# Patient Record
Sex: Male | Born: 1985 | Race: Black or African American | Hispanic: No | Marital: Single | Smoking: Never smoker
Health system: Southern US, Community
[De-identification: ages and names within clinical notes are randomized; demographics above are authoritative.]

---
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
## 2006-04-25 ENCOUNTER — Encounter (INDEPENDENT_AMBULATORY_CARE_PROVIDER_SITE_OTHER): Payer: Self-pay | Admitting: Family Medicine

## 2006-04-25 ENCOUNTER — Ambulatory Visit: Payer: Self-pay | Admitting: Family Medicine

## 2006-04-25 LAB — CONVERTED CEMR LAB: GC Probe Amp, Genital: POSITIVE — AB

## 2006-06-02 DIAGNOSIS — F909 Attention-deficit hyperactivity disorder, unspecified type: Secondary | ICD-10-CM | POA: Insufficient documentation

## 2006-07-05 ENCOUNTER — Encounter (INDEPENDENT_AMBULATORY_CARE_PROVIDER_SITE_OTHER): Payer: Self-pay | Admitting: Family Medicine

## 2006-07-21 ENCOUNTER — Ambulatory Visit: Payer: Self-pay | Admitting: Family Medicine

## 2006-07-21 ENCOUNTER — Encounter: Payer: Self-pay | Admitting: Family Medicine

## 2006-07-21 DIAGNOSIS — B360 Pityriasis versicolor: Secondary | ICD-10-CM

## 2006-07-22 LAB — CONVERTED CEMR LAB: Chlamydia, Swab/Urine, PCR: NEGATIVE

## 2006-10-26 ENCOUNTER — Encounter: Payer: Self-pay | Admitting: *Deleted

## 2008-04-11 ENCOUNTER — Encounter (INDEPENDENT_AMBULATORY_CARE_PROVIDER_SITE_OTHER): Payer: Self-pay | Admitting: *Deleted

## 2008-04-11 ENCOUNTER — Ambulatory Visit: Payer: Self-pay | Admitting: Family Medicine

## 2008-04-11 ENCOUNTER — Telehealth: Payer: Self-pay | Admitting: *Deleted

## 2008-04-11 ENCOUNTER — Encounter: Payer: Self-pay | Admitting: Family Medicine

## 2008-04-11 LAB — CONVERTED CEMR LAB
Chlamydia, DNA Probe: NEGATIVE
GC Probe Amp, Genital: NEGATIVE

## 2008-04-15 ENCOUNTER — Encounter: Payer: Self-pay | Admitting: *Deleted

## 2010-08-21 NOTE — Assessment & Plan Note (Signed)
[facility]                             [facility] OFFICE NOTE   Jonathan Marquez, Jonathan Marquez                     MRN:          [id number]  DATE:12/24/2005                            DOB:          1985/05/18    HISTORY:  A 25 year old black male new to the practice here to establish.  He has complaints only of eyes being sensitive to light and pain in his left  shoulder and dizzy with rolling over for about two years.  His shoulder  started hurting him when he was at wilderness camp.  A big log was dropped  on his left shoulder.  It hurt the day afterwards and has bothered him since  that time. He works at [employer] now and has hurt him much more since starting  work there.  His last doctor's appointment was two to three months ago.  He  was at [facility], saw a different doctor every time.  He wants to establish with one regular doctor.   PAST MEDICAL HISTORY:  He was seen in the emergency room for his left knee  pain.  Had pressure equalization tubes and tonsillectomy in 1995.  He denies  rheumatic fever, scarlet fever, Strep infection, high blood pressure, heart  disease, asthma, pneumonia, diabetes, liver disease or thyroid disease.   ALLERGIES:  He has no known drug allergies.   SOCIAL HABITS:  He has never smoked.  Has had two drinks in his lifetime.  He does not use drugs.   MEDICATIONS:  He is on no medications regularly.   REVIEW OF SYSTEMS:  Significant for dizziness that is often.  Syncope at  work in the heat at [employer] twice, no sequelae since.  Has ringing of his ears.  His last dental examination was 2004.  He has occasional difficulty  breathing due to being uncomfortable in bed with occasional shortness of  breath and coughing causing hemoptysis once.  He has had increased appetite  since working at [employer]. He has occasional nocturia with mild decreased stream,  otherwise head, ears, eyes, nose and throat, teeth and  gums,  cardiorespiratory, gastrointestinal, genitourinary systems are  noncontributory.   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  He is single. He does  have a girlfriend.  Sometimes uses protection, sometimes doesn't.   FAMILY HISTORY:  He is adopted and does not know his medical history at all  from his family other than his mother was an alcoholic and drug addict.   IMMUNIZATIONS:  His last tetanus shot was this year.   PHYSICAL EXAMINATION:  GENERAL APPEARANCE:  This is a well-developed, well-  nourished 25 year old black male in no acute distress.  VITAL SIGNS:  Temperature 98.3.  Pulse 72.  Blood pressure 110/60. Weight  161, 65-1/2 inches. NO KNOWN DRUG ALLERGIES.  HEENT:  Within normal limits.  NECK:  Without adenopathy.  Thyroid is without nodularity.  LUNGS:  Clear to auscultation.  BACK:  Straight, nontender with no costovertebral angle tenderness.  HEART:  Regular rate and rhythm without murmurs.  Pulses are 2+ throughout.  Carotids without bruits.  CHEST:  Symmetrical to excursion.  ABDOMEN:  Soft, nontender.  Good bowel sounds.  No masses.  EXTREMITIES:  Without cyanosis, clubbing or edema.  NEUROLOGICAL:  Examination is significant for cranial nerves II-XII within  normal limits.  Motor and sensory are normal.  Deep tendon reflexes are 2+  bilaterally.  Rapid alternating movements heel to shin, finger to nose,  tandem gait, heel walk, toe walk and Romberg are all normal.  SKIN:  Without lesions.   ASSESSMENT:  1. Vertigo, assume benign positional.  2. Left shoulder pain.   PLAN:  Trial of Antivert 25 mg q.6h. p.r.n. #30 with two refills.  Left  shoulder trauma and pain, will refer to an orthopedist.  Have him return  otherwise as needed.                                   Arta Silence, MD   RNS/MedQ  DD:  12/24/2005  DT:  12/27/2005  Job #:  [id number]

## 2017-05-16 ENCOUNTER — Encounter (HOSPITAL_COMMUNITY): Payer: Self-pay | Admitting: *Deleted

## 2017-05-16 ENCOUNTER — Other Ambulatory Visit: Payer: Self-pay

## 2017-05-16 DIAGNOSIS — N451 Epididymitis: Secondary | ICD-10-CM | POA: Insufficient documentation

## 2017-05-16 DIAGNOSIS — F909 Attention-deficit hyperactivity disorder, unspecified type: Secondary | ICD-10-CM | POA: Insufficient documentation

## 2017-05-16 LAB — URINALYSIS, ROUTINE W REFLEX MICROSCOPIC
Bilirubin Urine: NEGATIVE
Glucose, UA: NEGATIVE mg/dL
Hgb urine dipstick: NEGATIVE
KETONES UR: NEGATIVE mg/dL
LEUKOCYTES UA: NEGATIVE
NITRITE: NEGATIVE
Protein, ur: NEGATIVE mg/dL
Specific Gravity, Urine: 1.015 (ref 1.005–1.030)
pH: 5 (ref 5.0–8.0)

## 2017-05-16 NOTE — ED Triage Notes (Signed)
Pt was seen and treated for STD at the health dept and was diagnosed with nongonococcal urethritis. Reports L testicular pain for the past few days. Pain started after lifting tree branches. Also reports ejaculating a yellow discharge

## 2017-05-17 ENCOUNTER — Emergency Department (HOSPITAL_COMMUNITY)
Admission: EM | Admit: 2017-05-17 | Discharge: 2017-05-17 | Disposition: A | Discharge status: Home / Self Care | Payer: Self-pay | Attending: Emergency Medicine | Admitting: Emergency Medicine

## 2017-05-17 ENCOUNTER — Emergency Department (HOSPITAL_COMMUNITY): Payer: Self-pay

## 2017-05-17 DIAGNOSIS — N451 Epididymitis: Secondary | ICD-10-CM

## 2017-05-17 DIAGNOSIS — N50812 Left testicular pain: Secondary | ICD-10-CM

## 2017-05-17 DIAGNOSIS — N50819 Testicular pain, unspecified: Secondary | ICD-10-CM

## 2017-05-17 MED ORDER — DOXYCYCLINE HYCLATE 100 MG PO CAPS: 100.0000 mg | ORAL_CAPSULE | Freq: Two times a day (BID) | ORAL | 0 refills | Status: AC

## 2017-05-17 MED ORDER — DOXYCYCLINE HYCLATE 100 MG PO TABS
100.0000 mg | ORAL_TABLET | Freq: Once | ORAL | Status: AC
  Administered 2017-05-17: 100 mg via ORAL
  Filled 2017-05-17: qty 1

## 2017-05-17 MED ORDER — CEFTRIAXONE SODIUM 250 MG IJ SOLR
250.0000 mg | Freq: Once | INTRAMUSCULAR | Status: AC
  Administered 2017-05-17: 250 mg via INTRAMUSCULAR
  Filled 2017-05-17: qty 250

## 2017-05-17 NOTE — Discharge Instructions (Signed)
1. Medications: Doxycycline, usual home medications 2. Treatment: rest, drink plenty of fluids, use a condom with every sexual encounter 3. Follow Up: Please followup with your primary doctor in 3 days for discussion of your diagnoses and further evaluation after today's visit; if you do not have a primary care doctor use the resource guide provided to find one; Please return to the ER for worsening symptoms, high fevers or persistent vomiting.   Please inform all sexual partners if you test positive for any of these diseases.

## 2017-05-17 NOTE — ED Provider Notes (Signed)
[facility]   CSN: [id number] Arrival date & time: 05/16/17  2240     History   Chief Complaint Chief Complaint  Patient presents with  . Testicle Pain    HPI Jonathan Marquez is a 32 y.o. male with a hx of ADHD, tinea versicolor presents to the Emergency Department complaining of gradual, persistent, progressively worsening left testicular swelling and pain onset 2 days ago.  Pt reports approx 1 week ago he was evaluated at the health department for penile discharge.  At that time he was tested for STDs and was treated with Azithromycin.  Pt reports he was not given rocephin at that visit.  Pt denies known trauma, fever, nausea, vomiting, painful BMs.  Pt states he is sexually active with 1 male partner at this time, but was recently sexually active with several females.  Associated symptoms include lower abdominal pain and low back pain tonight.    The history is provided by the patient and medical records. No language interpreter was used.    History reviewed. No pertinent past medical history.  Patient Active Problem List   Diagnosis Date Noted  . 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 ATTENTION DEFICIT, W/HYPERACTIVITY 06/02/2006    History reviewed. No pertinent surgical history.     Home Medications    Prior to Admission medications   Medication Sig Start Date End Date Taking? Authorizing Provider  ibuprofen (ADVIL,MOTRIN) 200 MG tablet Take 200 mg by mouth every 6 (six) hours as needed for mild pain.   Yes [provider]  doxycycline (VIBRAMYCIN) 100 MG capsule Take 1 capsule (100 mg total) by mouth 2 (two) times daily. 05/17/17   Laurina Fischl, Dahlia ClientHannah, PA-C    Family History No family history on file.  Social History Social History   Tobacco Use  . Smoking status: Never Smoker  . Smokeless tobacco: Never Used  Substance Use Topics  . Alcohol use: Yes  . Drug use: Yes    Types: Marijuana      Allergies   Patient has no known allergies.   Review of Systems Review of Systems  Constitutional: Negative for appetite change, diaphoresis, fatigue, fever and unexpected weight change.  HENT: Negative for mouth sores.   Eyes: Negative for visual disturbance.  Respiratory: Negative for cough, chest tightness, shortness of breath and wheezing.   Cardiovascular: Negative for chest pain.  Gastrointestinal: Negative for abdominal pain, constipation, diarrhea, nausea and vomiting.  Endocrine: Negative for polydipsia, polyphagia and polyuria.  Genitourinary: Positive for scrotal swelling and testicular pain. Negative for dysuria, frequency, hematuria and urgency.  Musculoskeletal: Negative for back pain and neck stiffness.  Skin: Negative for rash.  Allergic/Immunologic: Negative for immunocompromised state.  Neurological: Negative for syncope, light-headedness and headaches.  Hematological: Does not bruise/bleed easily.  Psychiatric/Behavioral: Negative for sleep disturbance. The patient is not nervous/anxious.      Physical Exam Updated Vital Signs BP (!) 159/103 (BP Location: Right Arm)   Pulse 69   Temp 98.2 F (36.8 C) (Oral)   Resp 18   SpO2 97%   Physical Exam  Constitutional: He appears well-developed and well-nourished. No distress.  Awake, alert, nontoxic appearance  HENT:  Head: Normocephalic and atraumatic.  Eyes: Conjunctivae are normal. No scleral icterus.  Neck: Normal range of motion. Neck supple.  Cardiovascular: Normal rate, regular rhythm and intact distal pulses.  Pulses:      Radial pulses are 2+ on the right side, and 2+ on the left side.  Pulmonary/Chest: Effort normal. No respiratory distress.  Equal chest expansion  Abdominal: Soft. Bowel sounds are normal. He exhibits no mass. There is no tenderness. There is no rebound and no guarding. Hernia confirmed negative in the right inguinal area and confirmed negative in the left inguinal area.   Genitourinary: Penis normal. Right testis shows no mass, no swelling and no tenderness. Right testis is descended. Cremasteric reflex is not absent on the right side. Left testis shows swelling and tenderness. Left testis shows no mass. Left testis is descended. Cremasteric reflex is not absent on the left side. Circumcised. No penile tenderness. No discharge found.  Genitourinary Comments: Chaperone present  Musculoskeletal: Normal range of motion. He exhibits no edema.  Lymphadenopathy: Inguinal adenopathy noted on the left side. No inguinal adenopathy noted on the right side.  Neurological: He is alert.  Speech is clear and goal oriented Moves extremities without ataxia  Skin: Skin is warm and dry. He is not diaphoretic.  Psychiatric: He has a normal mood and affect.  Nursing note and vitals reviewed.    ED Treatments / Results  Labs (all labs ordered are listed, but only abnormal results are displayed) Labs Reviewed  URINALYSIS, ROUTINE W REFLEX MICROSCOPIC     Radiology [place] Scrotom Doppler  Result Date: 05/17/2017 CLINICAL DATA:  32 y/o  M; left testicle pain for 1 week. EXAM: SCROTAL ULTRASOUND DOPPLER ULTRASOUND OF THE TESTICLES TECHNIQUE: Complete ultrasound examination of the testicles, epididymis, and other scrotal structures was performed. Color and spectral Doppler ultrasound were also utilized to evaluate blood flow to the testicles. COMPARISON:  None. FINDINGS: Right testicle Measurements: 3.9 x 2.1 x 2.4 cm. No mass or microlithiasis visualized. Left testicle Measurements: 4.1 x 2.2 x 2.3 cm. No mass or microlithiasis visualized. Right epididymis:  Normal in size and appearance. Left epididymis: Heterogeneous, enlarged, hypervascular, measuring 1.7 x 0.9 x 1.4 cm. Hydrocele:  Left-sided hydrocele. Varicocele:  None visualized. Pulsed Doppler interrogation of both testes demonstrates normal low resistance arterial and venous waveforms bilaterally. Other: Mild left scrotal wall  edema. IMPRESSION: Acute left epididymitis. Electronically Signed   By: Mitzi Hansen M.D.   On: 05/17/2017 01:51    Procedures Procedures (including critical care time)  Medications Ordered in ED Medications  cefTRIAXone (ROCEPHIN) injection 250 mg (250 mg Intramuscular Given 05/17/17 0253)  doxycycline (VIBRA-TABS) tablet 100 mg (100 mg Oral Given 05/17/17 0253)     Initial Impression / Assessment and Plan / ED Course  I have reviewed the triage vital signs and the nursing notes.  Pertinent labs & imaging results that were available during my care of the patient were reviewed by me and considered in my medical decision making (see chart for details).     Patient presents for left testicle and scrotal pain.  He was recently treated for STDs with azithromycin but was not given Rocephin.  Urinalysis is without evidence of urinary tract infection.  Ultrasound shows acute left epididymitis.  Clinical exam correlates with this.  No evidence of testicular torsion.  Concerned that patient was not treated for potential gonorrhea.  Will give Rocephin here in the emergency department.  No hernias on exam.  Patient is afebrile, abdomen is soft and nontender, no painful bowel movements.  Doubt prostatitis.  Patient will be given doxycycline to take at home.  We did not repeat STD testing as this was completed with the health department though patient does not yet have results.  Discussed with patient the importance of completion of his  antibiotics and return to the emergency department for painful bowel movements, fever, worsening abdominal pain, vomiting or other concerns.  The patient states understanding and is in agreement with the plan.  Final Clinical Impressions(s) / ED Diagnoses   Final diagnoses:  Epididymitis  Testicular pain    ED Discharge Orders        Ordered    doxycycline (VIBRAMYCIN) 100 MG capsule  2 times daily     05/17/17 0324       Anneke Cundy, Dahlia Client,  PA-C 05/17/17 0329    Horton, Mayer Masker, MD 05/17/17 [phone]

## 2020-02-03 IMAGING — US US SCROTUM W/ DOPPLER COMPLETE
1 series · 14 of 25 positions shown · non-contrast
Comparison: None.

CLINICAL DATA: 31 y/o  M; left testicle pain for 1 week.

EXAM:
SCROTAL ULTRASOUND
DOPPLER ULTRASOUND OF THE TESTICLES
TECHNIQUE: Complete ultrasound examination of the testicles, epididymis, and
other scrotal structures was performed. Color and spectral Doppler
ultrasound were also utilized to evaluate blood flow to the
testicles.

[Series 1: us scrotum w/ doppler complete · 0.06mm/px · 14 of 52 slices shown]
[im 1/52]
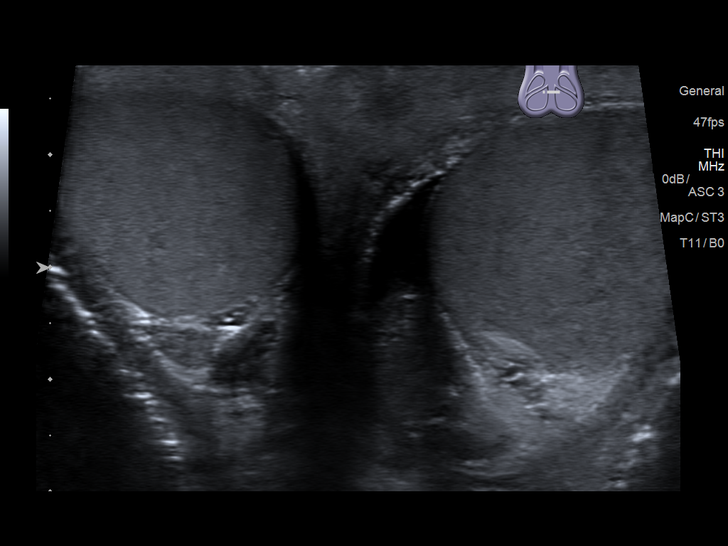
[im 5/52]
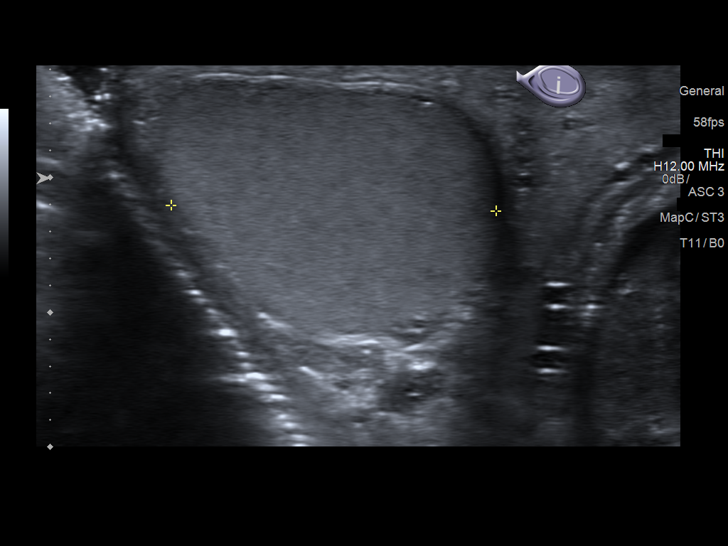
[im 9/52]
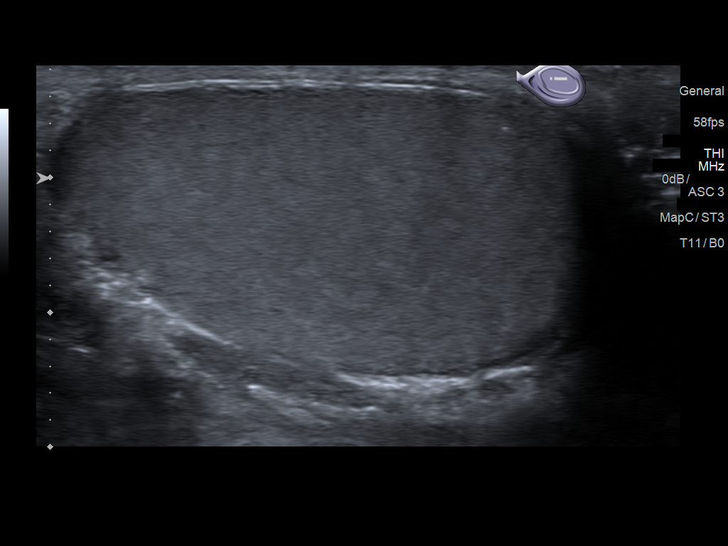
[im 13/52]
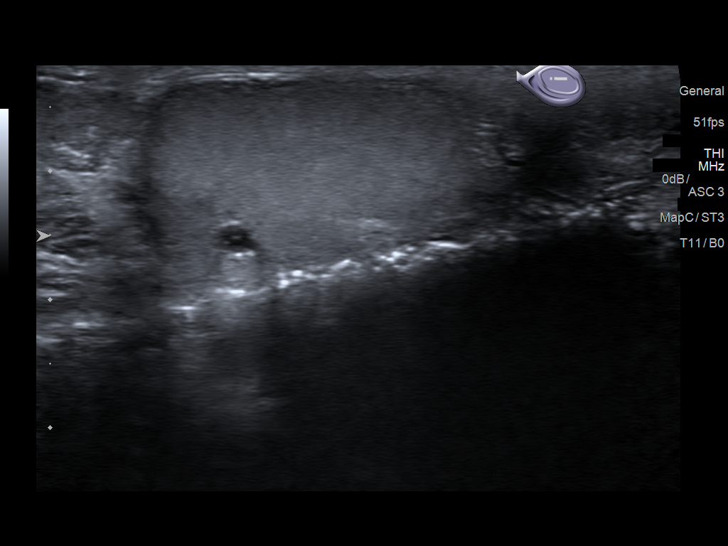
[im 18/52]
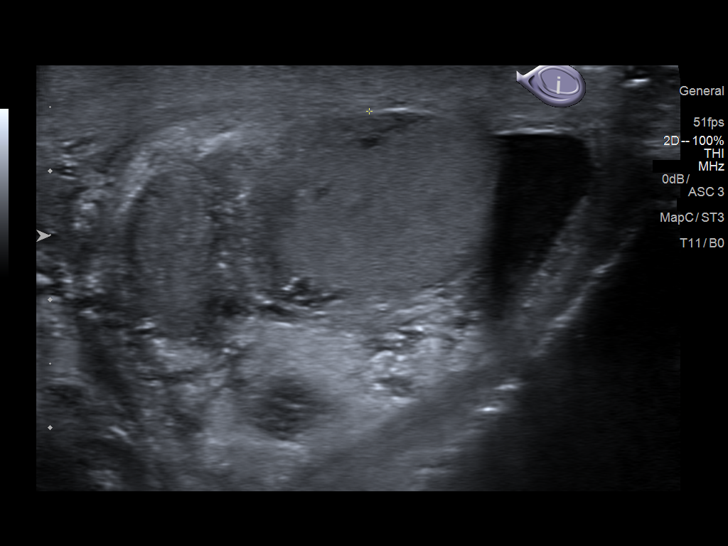
[im 20/52]
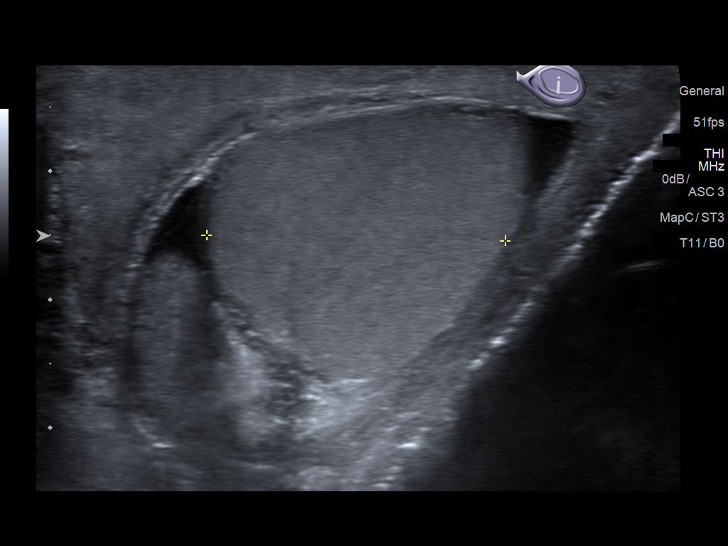
[im 24/52]
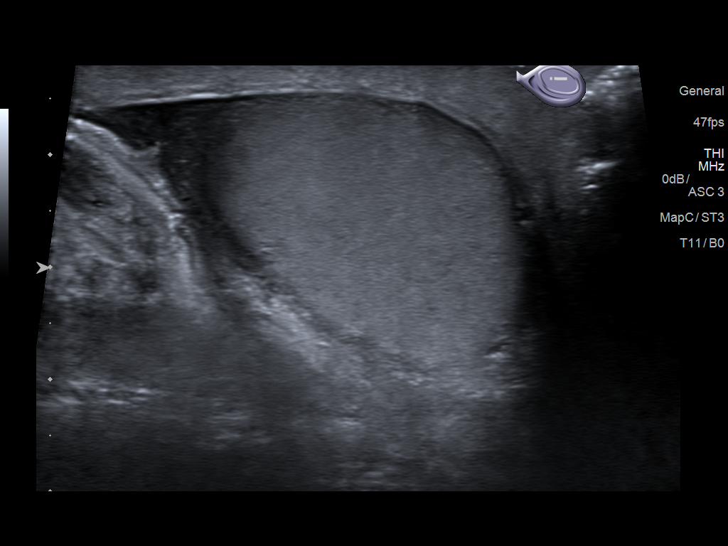
[im 28/52]
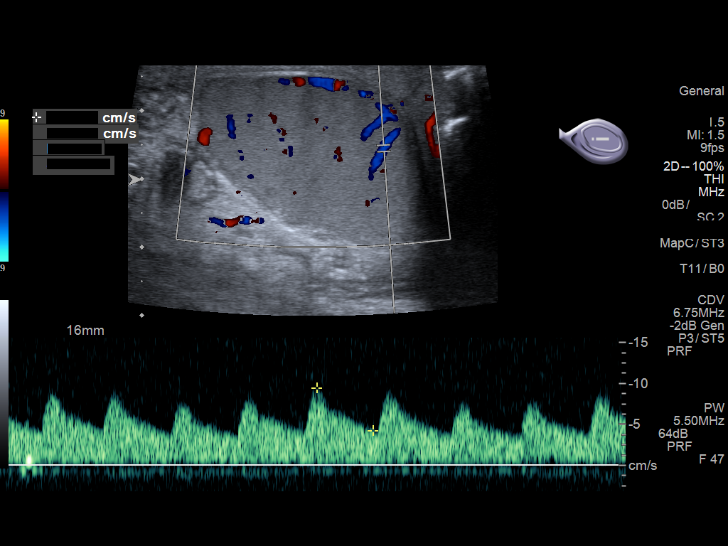
[im 32/52]
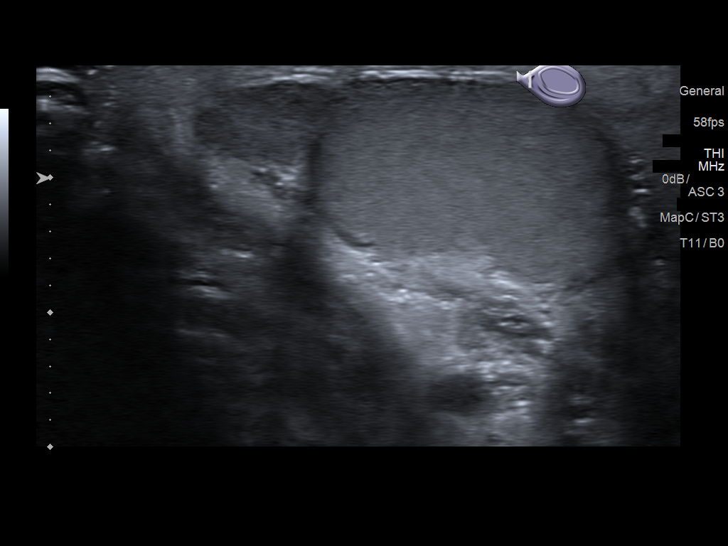
[im 35/52]
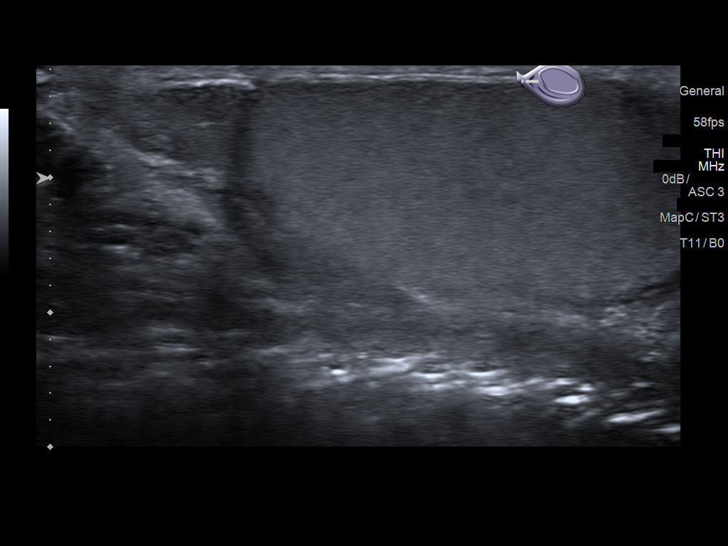
[im 39/52]
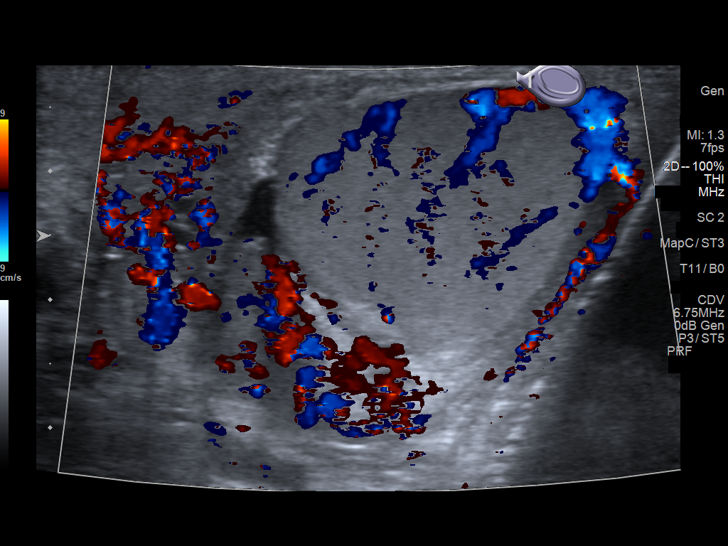
[im 43/52]
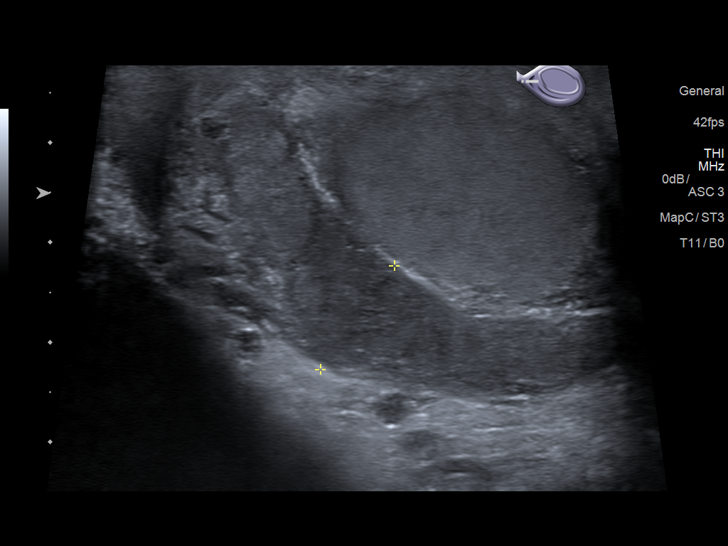
[im 47/52]
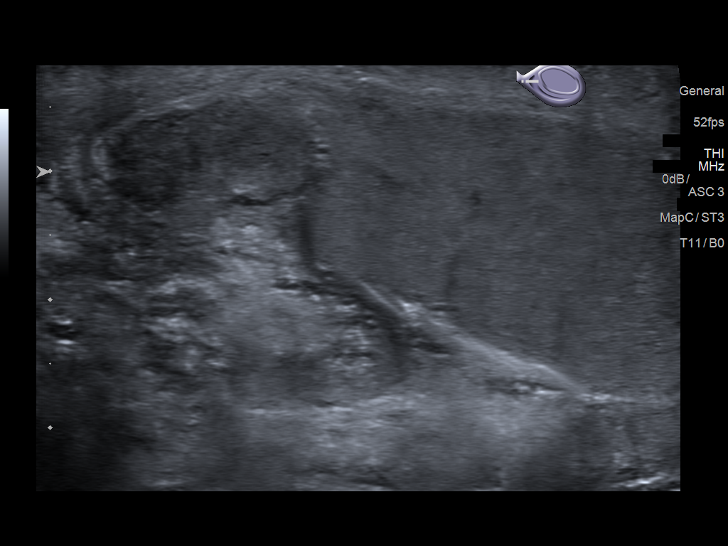
[im 52/52]
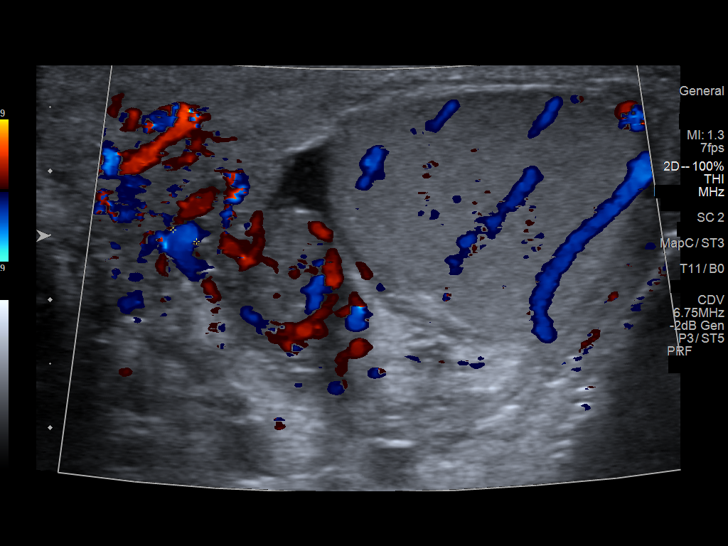

[14 of 25 positions shown; findings below may reference images not displayed]

FINDINGS: Right testicle

Measurements: 3.9 x 2.1 x 2.4 cm. No mass or microlithiasis
visualized.

Left testicle

Measurements: 4.1 x 2.2 x 2.3 cm. No mass or microlithiasis
visualized.

Right epididymis:  Normal in size and appearance.

Left epididymis: Heterogeneous, enlarged, hypervascular, measuring
1.7 x 0.9 x 1.4 cm.

Hydrocele:  Left-sided hydrocele.

Varicocele:  None visualized.

Pulsed Doppler interrogation of both testes demonstrates normal low
resistance arterial and venous waveforms bilaterally.

Other: Mild left scrotal wall edema.
IMPRESSION: Acute left epididymitis.

By: Dejian Law M.D.
